# Patient Record
Sex: Female | Born: 1965 | Race: White | Hispanic: No | Marital: Single | Smoking: Never smoker
Health system: Southern US, Community
[De-identification: ages and names within clinical notes are randomized; demographics above are authoritative.]

## PROBLEM LIST (undated history)

## (undated) DIAGNOSIS — I10 Essential (primary) hypertension: Secondary | ICD-10-CM

## (undated) DIAGNOSIS — E119 Type 2 diabetes mellitus without complications: Secondary | ICD-10-CM

## (undated) DIAGNOSIS — J449 Chronic obstructive pulmonary disease, unspecified: Secondary | ICD-10-CM

## (undated) DIAGNOSIS — I219 Acute myocardial infarction, unspecified: Secondary | ICD-10-CM

---

## 2020-01-10 DIAGNOSIS — E119 Type 2 diabetes mellitus without complications: Secondary | ICD-10-CM | POA: Diagnosis not present

## 2020-01-10 DIAGNOSIS — M79601 Pain in right arm: Secondary | ICD-10-CM | POA: Diagnosis not present

## 2020-01-10 DIAGNOSIS — G4730 Sleep apnea, unspecified: Secondary | ICD-10-CM | POA: Diagnosis not present

## 2020-01-10 DIAGNOSIS — I10 Essential (primary) hypertension: Secondary | ICD-10-CM | POA: Diagnosis not present

## 2020-01-10 DIAGNOSIS — Z1389 Encounter for screening for other disorder: Secondary | ICD-10-CM | POA: Diagnosis not present

## 2020-01-10 DIAGNOSIS — M4802 Spinal stenosis, cervical region: Secondary | ICD-10-CM | POA: Diagnosis not present

## 2020-01-10 DIAGNOSIS — E7800 Pure hypercholesterolemia, unspecified: Secondary | ICD-10-CM | POA: Diagnosis not present

## 2020-01-26 DIAGNOSIS — M5412 Radiculopathy, cervical region: Secondary | ICD-10-CM | POA: Diagnosis not present

## 2020-01-26 DIAGNOSIS — M4802 Spinal stenosis, cervical region: Secondary | ICD-10-CM | POA: Diagnosis not present

## 2021-04-11 ENCOUNTER — Inpatient Hospital Stay
Admission: EM | Admit: 2021-04-11 | Discharge: 2021-04-13 | DRG: 684 | Disposition: A | Discharge status: Home / Self Care | Payer: Medicare Other | Attending: Hospitalist | Admitting: Hospitalist

## 2021-04-11 ENCOUNTER — Emergency Department: Payer: Medicare Other

## 2021-04-11 ENCOUNTER — Other Ambulatory Visit: Payer: Self-pay

## 2021-04-11 DIAGNOSIS — I252 Old myocardial infarction: Secondary | ICD-10-CM

## 2021-04-11 DIAGNOSIS — R197 Diarrhea, unspecified: Secondary | ICD-10-CM

## 2021-04-11 DIAGNOSIS — J069 Acute upper respiratory infection, unspecified: Secondary | ICD-10-CM

## 2021-04-11 DIAGNOSIS — A084 Viral intestinal infection, unspecified: Secondary | ICD-10-CM | POA: Diagnosis present

## 2021-04-11 DIAGNOSIS — N179 Acute kidney failure, unspecified: Secondary | ICD-10-CM | POA: Diagnosis not present

## 2021-04-11 DIAGNOSIS — E119 Type 2 diabetes mellitus without complications: Secondary | ICD-10-CM

## 2021-04-11 DIAGNOSIS — Z20822 Contact with and (suspected) exposure to covid-19: Secondary | ICD-10-CM | POA: Diagnosis present

## 2021-04-11 DIAGNOSIS — E860 Dehydration: Secondary | ICD-10-CM | POA: Diagnosis not present

## 2021-04-11 DIAGNOSIS — Z79899 Other long term (current) drug therapy: Secondary | ICD-10-CM

## 2021-04-11 DIAGNOSIS — R42 Dizziness and giddiness: Secondary | ICD-10-CM

## 2021-04-11 DIAGNOSIS — I10 Essential (primary) hypertension: Secondary | ICD-10-CM

## 2021-04-11 DIAGNOSIS — R1110 Vomiting, unspecified: Secondary | ICD-10-CM

## 2021-04-11 DIAGNOSIS — J449 Chronic obstructive pulmonary disease, unspecified: Secondary | ICD-10-CM | POA: Diagnosis present

## 2021-04-11 DIAGNOSIS — I951 Orthostatic hypotension: Secondary | ICD-10-CM | POA: Diagnosis present

## 2021-04-11 DIAGNOSIS — R55 Syncope and collapse: Secondary | ICD-10-CM

## 2021-04-11 DIAGNOSIS — R748 Abnormal levels of other serum enzymes: Secondary | ICD-10-CM

## 2021-04-11 DIAGNOSIS — Z885 Allergy status to narcotic agent status: Secondary | ICD-10-CM

## 2021-04-11 DIAGNOSIS — Z7984 Long term (current) use of oral hypoglycemic drugs: Secondary | ICD-10-CM

## 2021-04-11 HISTORY — DX: Acute myocardial infarction, unspecified: I21.9

## 2021-04-11 HISTORY — DX: Essential (primary) hypertension: I10

## 2021-04-11 HISTORY — DX: Type 2 diabetes mellitus without complications: E11.9

## 2021-04-11 HISTORY — DX: Chronic obstructive pulmonary disease, unspecified: J44.9

## 2021-04-11 LAB — COMPREHENSIVE METABOLIC PANEL
ALT: 19 U/L (ref 0–44)
AST: 27 U/L (ref 15–41)
Albumin: 4.1 g/dL (ref 3.5–5.0)
Alkaline Phosphatase: 77 U/L (ref 38–126)
Anion gap: 12 (ref 5–15)
BUN: 37 mg/dL — ABNORMAL HIGH (ref 6–20)
CO2: 22 mmol/L (ref 22–32)
Calcium: 9.2 mg/dL (ref 8.9–10.3)
Chloride: 99 mmol/L (ref 98–111)
Creatinine, Ser: 2.77 mg/dL — ABNORMAL HIGH (ref 0.44–1.00)
GFR, Estimated: 20 mL/min — ABNORMAL LOW (ref >60)
Glucose, Bld: 101 mg/dL — ABNORMAL HIGH (ref 70–99)
Potassium: 3.9 mmol/L (ref 3.5–5.1)
Sodium: 133 mmol/L — ABNORMAL LOW (ref 135–145)
Total Bilirubin: 0.7 mg/dL (ref 0.3–1.2)
Total Protein: 7.4 g/dL (ref 6.5–8.1)

## 2021-04-11 LAB — CBC
HCT: 32.1 % — ABNORMAL LOW (ref 36.0–46.0)
Hemoglobin: 11.3 g/dL — ABNORMAL LOW (ref 12.0–15.0)
MCH: 30.3 pg (ref 26.0–34.0)
MCHC: 35.2 g/dL (ref 30.0–36.0)
MCV: 86.1 fL (ref 80.0–100.0)
Platelets: 269 10*3/uL (ref 150–400)
RBC: 3.73 MIL/uL — ABNORMAL LOW (ref 3.87–5.11)
RDW: 12.7 % (ref 11.5–15.5)
WBC: 8.8 10*3/uL (ref 4.0–10.5)
nRBC: 0 % (ref 0.0–0.2)

## 2021-04-11 LAB — LACTIC ACID, PLASMA: Lactic Acid, Venous: 0.9 mmol/L (ref 0.5–1.9)

## 2021-04-11 LAB — LIPASE, BLOOD: Lipase: 76 U/L — ABNORMAL HIGH (ref 11–51)

## 2021-04-11 LAB — CBG MONITORING, ED: Glucose-Capillary: 137 mg/dL — ABNORMAL HIGH (ref 70–99)

## 2021-04-11 MED ORDER — LACTATED RINGERS IV BOLUS
1000.0000 mL | Freq: Once | INTRAVENOUS | Status: AC
  Administered 2021-04-11: 1000 mL via INTRAVENOUS

## 2021-04-11 MED ORDER — ONDANSETRON HCL 4 MG/2ML IJ SOLN
4.0000 mg | Freq: Once | INTRAMUSCULAR | Status: AC
  Administered 2021-04-11: 4 mg via INTRAVENOUS
  Filled 2021-04-11: qty 2

## 2021-04-11 MED ORDER — ONDANSETRON 4 MG PO TBDP
4.0000 mg | ORAL_TABLET | Freq: Once | ORAL | Status: AC | PRN
  Administered 2021-04-11: 4 mg via ORAL
  Filled 2021-04-11: qty 1

## 2021-04-11 NOTE — ED Triage Notes (Signed)
First Nurse Note:  Arrives stating that she has pneumonia and blood sugars have been running low today.  States was seen by PCP yesterday for pneumonia, but has not heard anything from them today and has not been started on antibiotics.    AAOx3.  Skin warm and dry.  Strong cough.  NAD

## 2021-04-11 NOTE — ED Triage Notes (Signed)
Pt reports that she thinks that she has pneumonia and that her blood sugar has been running low. She has a cough and nasal congestion. If she eats something she reports that if she doesn't keep it down it comes out as diarrhea.

## 2021-04-11 NOTE — ED Provider Notes (Signed)
[facility] Emergency Department Provider Note  ____________________________________________   Event Date/Time   First MD Initiated Contact with Patient 04/11/21 2301     (approximate)  I have reviewed the triage vital signs and the nursing notes.   HISTORY  Chief Complaint Emesis and Diarrhea    HPI Heather Glover is a 55 y.o. female here with nausea, vomiting, cough, general fatigue.  The patient states that her symptoms have been ongoing for the last several days.  She has had a mild cough, chest congestion, nausea, and poor p.o. intake.  Over the last 24 hours, her symptoms have worsened and she has developed significant lightheadedness with standing, general fatigue, and malaise.  She has felt very weak.  She actually fell today, striking her head, though she does not recall losing consciousness.  She is not on blood thinners.  She describes some mild, generalized abdominal discomfort as well, and general fatigue.  She has had cough and occasional shortness of breath.  She has had sputum production.  No other acute complaints.  No recent medication changes.No known sick contacts.  Denies history of kidney disease.    No past medical history on file.  There are no problems to display for this patient.     Prior to Admission medications   Not on File    Allergies Percocet [oxycodone-acetaminophen]  No family history on file.  Social History    Review of Systems  Review of Systems  Constitutional:  Positive for fatigue. Negative for fever.  HENT:  Negative for congestion and sore throat.   Eyes:  Negative for visual disturbance.  Respiratory:  Positive for cough and shortness of breath.   Cardiovascular:  Negative for chest pain.  Gastrointestinal:  Positive for nausea and vomiting. Negative for abdominal pain and diarrhea.  Genitourinary:  Negative for flank pain.  Musculoskeletal:  Negative for back pain and neck pain.  Skin:   Negative for rash and wound.  Neurological:  Positive for weakness.  All other systems reviewed and are negative.   ____________________________________________  PHYSICAL EXAM:      VITAL SIGNS: ED Triage Vitals  Enc Vitals Group     BP 04/11/21 1628 105/65     Pulse Rate 04/11/21 1628 71     Resp 04/11/21 1628 20     Temp 04/11/21 1628 99.3 F (37.4 C)     Temp Source 04/11/21 1628 Oral     SpO2 04/11/21 1628 95 %     Weight 04/11/21 1628 145 lb (65.8 kg)     Height 04/11/21 1628 5\' 2"  (1.575 m)     Head Circumference --      Peak Flow --      Pain Score 04/11/21 1656 8     Pain Loc --      Pain Edu? --      Excl. in GC? --      Physical Exam Vitals and nursing note reviewed.  Constitutional:      General: She is not in acute distress.    Appearance: She is well-developed.  HENT:     Head: Normocephalic and atraumatic.     Mouth/Throat:     Mouth: Mucous membranes are dry.  Eyes:     Conjunctiva/sclera: Conjunctivae normal.  Cardiovascular:     Rate and Rhythm: Normal rate and regular rhythm.     Heart sounds: Normal heart sounds. No murmur heard.   No friction rub.  Pulmonary:  Effort: Pulmonary effort is normal. No respiratory distress.     Breath sounds: Normal breath sounds. No wheezing or rales.  Abdominal:     General: There is no distension.     Palpations: Abdomen is soft.     Tenderness: There is no abdominal tenderness (mild, epigastric).  Musculoskeletal:     Cervical back: Neck supple.  Skin:    General: Skin is warm.     Capillary Refill: Capillary refill takes less than 2 seconds.  Neurological:     Mental Status: She is alert and oriented to person, place, and time.     Motor: No abnormal muscle tone.      ____________________________________________   LABS (all labs ordered are listed, but only abnormal results are displayed)  Labs Reviewed  LIPASE, BLOOD - Abnormal; Notable for the following components:      Result Value    Lipase 76 (*)    All other components within normal limits  COMPREHENSIVE METABOLIC PANEL - Abnormal; Notable for the following components:   Sodium 133 (*)    Glucose, Bld 101 (*)    BUN 37 (*)    Creatinine, Ser 2.77 (*)    GFR, Estimated 20 (*)    All other components within normal limits  CBC - Abnormal; Notable for the following components:   RBC 3.73 (*)    Hemoglobin 11.3 (*)    HCT 32.1 (*)    All other components within normal limits  URINALYSIS, COMPLETE (UACMP) WITH MICROSCOPIC - Abnormal; Notable for the following components:   Color, Urine YELLOW (*)    APPearance HAZY (*)    Ketones, ur 5 (*)    All other components within normal limits  CBG MONITORING, ED - Abnormal; Notable for the following components:   Glucose-Capillary 137 (*)    All other components within normal limits  RESP PANEL BY RT-PCR (FLU A&B, COVID) ARPGX2  CULTURE, BLOOD (SINGLE)  LACTIC ACID, PLASMA  LACTIC ACID, PLASMA  POC URINE PREG, ED  TROPONIN I (HIGH SENSITIVITY)    ____________________________________________  EKG: Normal sinus rhythm, ventricular rate 65.  PR 124, QRS 106, QTc 394.  No acute ST elevations or depressions.  Nonspecific ST changes. ________________________________________  RADIOLOGY All imaging, including plain films, CT scans, and ultrasounds, independently reviewed by me, and interpretations confirmed via formal radiology reads.  ED MD interpretation:   Chest x-ray: No acute abnormality CT abdomen/pelvis: Cystic lesion lower pole left kidney, otherwise unremarkable  Official radiology report(s): CT ABDOMEN PELVIS WO CONTRAST  Result Date: 04/11/2021 CLINICAL DATA:  Abdomen pain EXAM: CT ABDOMEN AND PELVIS WITHOUT CONTRAST TECHNIQUE: Multidetector CT imaging of the abdomen and pelvis was performed following the standard protocol without IV contrast. COMPARISON:  None. FINDINGS: Lower chest: Lung bases demonstrate no acute consolidation or effusion. Normal cardiac  size. Hepatobiliary: Calcified granuloma in the liver. No calcified gallstone or biliary dilatation. Pancreas: Unremarkable. No pancreatic ductal dilatation or surrounding inflammatory changes. Spleen: Normal in size without focal abnormality. Adrenals/Urinary Tract: Adrenal glands are normal. No hydronephrosis. Multiple low-attenuation lesions bilaterally incompletely characterized without contrast. Complex exophytic cyst lower pole left kidney measuring 5 x 3.6 cm with septations and calcifications. The bladder is normal Stomach/Bowel: Stomach is within normal limits. Appendix appears normal. No evidence of bowel wall thickening, distention, or inflammatory changes. 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 Vascular/Lymphatic: Mild aortic atherosclerosis. No aneurysm. No suspicious nodes Reproductive: Uterus and bilateral adnexa are unremarkable. Other: Negative for free air or free  fluid. Musculoskeletal: No acute osseous abnormality. Degenerative changes at L5-S1 IMPRESSION: 1. No CT evidence for acute intra-abdominal or pelvic abnormality. 2. Complex cystic lesion lower pole left kidney. When the patient is clinically stable and able to follow directions and hold their breath (preferably as an outpatient) further evaluation with dedicated abdominal MRI should be considered. 3. 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 Electronically Signed   By: Jasmine Pang M.D.   On: 04/11/2021 23:46   DG Chest 2 View  Result Date: 04/11/2021 CLINICAL DATA:  Cough, concern for pneumonia EXAM: CHEST - 2 VIEW COMPARISON:  None. FINDINGS: The heart size and mediastinal contours are within normal limits. Both lungs are clear. The visualized skeletal structures are unremarkable. IMPRESSION: No acute abnormality of the lungs. Electronically Signed   By: Lauralyn Primes M.D.   On: 04/11/2021 17:50    ____________________________________________  PROCEDURES   Procedure(s)  performed (including Critical Care):  Procedures  ____________________________________________  INITIAL IMPRESSION / MDM / ASSESSMENT AND PLAN / ED COURSE  As part of my medical decision making, I reviewed the following data within the electronic MEDICAL RECORD NUMBER Nursing notes reviewed and incorporated, Old chart reviewed, Notes from prior ED visits, and [place] Controlled Substance Database       *Heather Glover was evaluated in Emergency Department on 04/12/2021 for the symptoms described in the history of present illness. She was evaluated in the context of the global COVID-19 pandemic, which necessitated consideration that the patient might be at risk for infection with the SARS-CoV-2 virus that causes COVID-19. Institutional protocols and algorithms that pertain to the evaluation of patients at risk for COVID-19 are in a state of rapid change based on information released by regulatory bodies including the CDC and federal and state organizations. These policies and algorithms were followed during the patient's care in the ED.  Some ED evaluations and interventions may be delayed as a result of limited staffing during the pandemic.*     Medical Decision Making: 55 year old female here with nausea, cough, general fatigue, decreased appetite.  Patient states that her symptoms have been ongoing for several days.  She appears dehydrated clinically with orthostasis.  Lab work shows significant likely AKI as the patient has no known history of chronic kidney disease.  BUN 37, creatinine 2.77.  CBC without leukocytosis.  Lipase minimally elevated but CT scan obtained, reviewed, shows no evidence of pancreatitis.  She has a renal cyst which she states she has known about, but again denies any history of CKD.  UA shows mild dehydration.  She will be admitted for IV fluids and monitoring of lab work.  ____________________________________________  FINAL CLINICAL IMPRESSION(S) / ED DIAGNOSES  Final  diagnoses:  AKI (acute kidney injury) (HCC)  Dehydration     MEDICATIONS GIVEN DURING THIS VISIT:  Medications  ondansetron (ZOFRAN-ODT) disintegrating tablet 4 mg (4 mg Oral Given 04/11/21 1703)  lactated ringers bolus 1,000 mL (1,000 mLs Intravenous New Bag/Given 04/11/21 2323)  ondansetron (ZOFRAN) injection 4 mg (4 mg Intravenous Given 04/11/21 2359)     ED Discharge Orders     None        Note:  This document was prepared using Dragon voice recognition software and may include unintentional dictation errors.   Shaune Pollack, MD 04/12/21 [phone]

## 2021-04-12 ENCOUNTER — Encounter: Payer: Self-pay | Admitting: Internal Medicine

## 2021-04-12 ENCOUNTER — Other Ambulatory Visit: Payer: Self-pay

## 2021-04-12 DIAGNOSIS — I10 Essential (primary) hypertension: Secondary | ICD-10-CM

## 2021-04-12 DIAGNOSIS — N179 Acute kidney failure, unspecified: Secondary | ICD-10-CM | POA: Diagnosis present

## 2021-04-12 DIAGNOSIS — R197 Diarrhea, unspecified: Secondary | ICD-10-CM

## 2021-04-12 DIAGNOSIS — Z885 Allergy status to narcotic agent status: Secondary | ICD-10-CM | POA: Diagnosis not present

## 2021-04-12 DIAGNOSIS — Z7984 Long term (current) use of oral hypoglycemic drugs: Secondary | ICD-10-CM | POA: Diagnosis not present

## 2021-04-12 DIAGNOSIS — I951 Orthostatic hypotension: Secondary | ICD-10-CM | POA: Diagnosis present

## 2021-04-12 DIAGNOSIS — R748 Abnormal levels of other serum enzymes: Secondary | ICD-10-CM | POA: Diagnosis present

## 2021-04-12 DIAGNOSIS — I252 Old myocardial infarction: Secondary | ICD-10-CM | POA: Diagnosis not present

## 2021-04-12 DIAGNOSIS — J069 Acute upper respiratory infection, unspecified: Secondary | ICD-10-CM | POA: Diagnosis present

## 2021-04-12 DIAGNOSIS — E119 Type 2 diabetes mellitus without complications: Secondary | ICD-10-CM

## 2021-04-12 DIAGNOSIS — R55 Syncope and collapse: Secondary | ICD-10-CM | POA: Diagnosis not present

## 2021-04-12 DIAGNOSIS — Z79899 Other long term (current) drug therapy: Secondary | ICD-10-CM | POA: Diagnosis not present

## 2021-04-12 DIAGNOSIS — E860 Dehydration: Secondary | ICD-10-CM | POA: Diagnosis present

## 2021-04-12 DIAGNOSIS — Z20822 Contact with and (suspected) exposure to covid-19: Secondary | ICD-10-CM | POA: Diagnosis present

## 2021-04-12 DIAGNOSIS — R42 Dizziness and giddiness: Secondary | ICD-10-CM | POA: Diagnosis not present

## 2021-04-12 DIAGNOSIS — J449 Chronic obstructive pulmonary disease, unspecified: Secondary | ICD-10-CM | POA: Diagnosis present

## 2021-04-12 DIAGNOSIS — R1110 Vomiting, unspecified: Secondary | ICD-10-CM

## 2021-04-12 DIAGNOSIS — A084 Viral intestinal infection, unspecified: Secondary | ICD-10-CM | POA: Diagnosis present

## 2021-04-12 LAB — GLUCOSE, CAPILLARY
Glucose-Capillary: 106 mg/dL — ABNORMAL HIGH (ref 70–99)
Glucose-Capillary: 121 mg/dL — ABNORMAL HIGH (ref 70–99)
Glucose-Capillary: 117 mg/dL — ABNORMAL HIGH (ref 70–99)
Glucose-Capillary: 113 mg/dL — ABNORMAL HIGH (ref 70–99)
Glucose-Capillary: 162 mg/dL — ABNORMAL HIGH (ref 70–99)
Glucose-Capillary: 146 mg/dL — ABNORMAL HIGH (ref 70–99)

## 2021-04-12 LAB — URINALYSIS, COMPLETE (UACMP) WITH MICROSCOPIC
Bacteria, UA: NONE SEEN
Bilirubin Urine: NEGATIVE
Glucose, UA: NEGATIVE mg/dL
Hgb urine dipstick: NEGATIVE
Ketones, ur: 5 mg/dL — AB
Leukocytes,Ua: NEGATIVE
Nitrite: NEGATIVE
Protein, ur: NEGATIVE mg/dL
Specific Gravity, Urine: 1.018 (ref 1.005–1.030)
pH: 5 (ref 5.0–8.0)

## 2021-04-12 LAB — BASIC METABOLIC PANEL
Anion gap: 9 (ref 5–15)
BUN: 34 mg/dL — ABNORMAL HIGH (ref 6–20)
CO2: 23 mmol/L (ref 22–32)
Calcium: 9 mg/dL (ref 8.9–10.3)
Chloride: 104 mmol/L (ref 98–111)
Creatinine, Ser: 2.01 mg/dL — ABNORMAL HIGH (ref 0.44–1.00)
GFR, Estimated: 29 mL/min — ABNORMAL LOW (ref >60)
Glucose, Bld: 103 mg/dL — ABNORMAL HIGH (ref 70–99)
Potassium: 3.3 mmol/L — ABNORMAL LOW (ref 3.5–5.1)
Sodium: 136 mmol/L (ref 135–145)

## 2021-04-12 LAB — HEMOGLOBIN A1C
Hgb A1c MFr Bld: 6.4 % — ABNORMAL HIGH (ref 4.8–5.6)
Mean Plasma Glucose: 136.98 mg/dL

## 2021-04-12 LAB — TROPONIN I (HIGH SENSITIVITY): Troponin I (High Sensitivity): 9 ng/L (ref <18)

## 2021-04-12 LAB — HIV ANTIBODY (ROUTINE TESTING W REFLEX): HIV Screen 4th Generation wRfx: NONREACTIVE

## 2021-04-12 LAB — RESP PANEL BY RT-PCR (FLU A&B, COVID) ARPGX2
Influenza A by PCR: NEGATIVE
Influenza B by PCR: NEGATIVE
SARS Coronavirus 2 by RT PCR: NEGATIVE

## 2021-04-12 LAB — LACTIC ACID, PLASMA: Lactic Acid, Venous: 0.8 mmol/L (ref 0.5–1.9)

## 2021-04-12 LAB — POC URINE PREG, ED: Preg Test, Ur: NEGATIVE

## 2021-04-12 LAB — MAGNESIUM: Magnesium: 2 mg/dL (ref 1.7–2.4)

## 2021-04-12 LAB — LIPASE, BLOOD: Lipase: 80 U/L — ABNORMAL HIGH (ref 11–51)

## 2021-04-12 MED ORDER — INSULIN ASPART 100 UNIT/ML IJ SOLN
0.0000 [IU] | INTRAMUSCULAR | Status: DC
  Filled 2021-04-12: qty 1

## 2021-04-12 MED ORDER — ONDANSETRON HCL 4 MG PO TABS: 4.0000 mg | ORAL_TABLET | Freq: Four times a day (QID) | ORAL | Status: DC | PRN

## 2021-04-12 MED ORDER — POTASSIUM CHLORIDE CRYS ER 20 MEQ PO TBCR
40.0000 meq | EXTENDED_RELEASE_TABLET | Freq: Once | ORAL | Status: AC
  Administered 2021-04-12: 40 meq via ORAL
  Filled 2021-04-12: qty 2

## 2021-04-12 MED ORDER — SODIUM CHLORIDE 0.9 % IV SOLN: INTRAVENOUS | Status: DC

## 2021-04-12 MED ORDER — ACETAMINOPHEN 650 MG RE SUPP: 650.0000 mg | Freq: Four times a day (QID) | RECTAL | Status: DC | PRN

## 2021-04-12 MED ORDER — PANTOPRAZOLE SODIUM 40 MG IV SOLR
40.0000 mg | INTRAVENOUS | Status: DC
  Administered 2021-04-12 – 2021-04-13 (×2): 40 mg via INTRAVENOUS
  Filled 2021-04-12 (×2): qty 40

## 2021-04-12 MED ORDER — ONDANSETRON HCL 4 MG/2ML IJ SOLN
4.0000 mg | Freq: Four times a day (QID) | INTRAMUSCULAR | Status: DC | PRN
  Filled 2021-04-12: qty 2

## 2021-04-12 MED ORDER — ACETAMINOPHEN 325 MG PO TABS: 650.0000 mg | ORAL_TABLET | Freq: Four times a day (QID) | ORAL | Status: DC | PRN

## 2021-04-12 MED ORDER — ENOXAPARIN SODIUM 30 MG/0.3ML IJ SOSY
30.0000 mg | PREFILLED_SYRINGE | INTRAMUSCULAR | Status: DC
  Administered 2021-04-12 – 2021-04-13 (×2): 30 mg via SUBCUTANEOUS
  Filled 2021-04-12 (×2): qty 0.3

## 2021-04-12 NOTE — H&P (Addendum)
History and Physical    Heather Glover YKD:[id number] DOB: 1965/10/24 DOA: 04/11/2021  PCP: Pcp, No   Patient coming from: [facility]  I have personally briefly reviewed patient's old medical records in [facility]  Chief Complaint: Cough, vomiting, diarrhea, weakness, lightheadedness  HPI: Heather Glover is a 55 y.o. female with medical history significant for Diabetes, HTN, COPD and hx of MI who presents to the ED with a several day history of productive cough, chest congestion, low grade fever, nausea and vomiting, poor oral intake and occasional diarrhea.  She has moderate epigastric pain related to vomiting. Vomiting is non bloody and non bilious.  She had a negative covid test at her doctor's office three days ago. Over the past couple days she became increasingly weaker and lightheaded until the night of arrival when she almost passed out. She fell but did not lose consciousness.  No injury sustained.  ED course: On arrival, temperature 99.3, BP 105/65 with pulse 71 and O2 sat 95% on room air WBC normal at 8800, hemoglobin 11.1, creatinine 2.77.  LFTs WNL, lipase 76.  Lactic acid 0.9.  Troponin 9.  Urinalysis with few ketones  EKG, personally viewed and interpreted: Sinus rhythm at 65 with nonspecific ST-T wave changes  Imaging: Chest x-ray with no acute abnormality of the lungs CT abdomen and pelvis without contrast no acute intra-abdominal or pelvic abnormality  Patient treated with an IV fluid bolus and given Zofran.  Hospitalist consulted for admission.  Review of Systems: As per HPI otherwise all other systems on review of systems negative.    Past Medical History:  Diagnosis Date   COPD (chronic obstructive pulmonary disease) (HCC)    Diabetes (HCC)    HTN (hypertension)    MI (myocardial infarction) (HCC)     History reviewed. No pertinent surgical history.   reports that she has never smoked. She has never used smokeless tobacco. She reports previous alcohol use.  No history on file for drug use.  Allergies  Allergen Reactions   Acetaminophen    Oxycodone Hcl    Percocet [Oxycodone-Acetaminophen]     History reviewed. No pertinent family history.    Prior to Admission medications   Medication Sig Start Date End Date Taking? Authorizing Provider  amLODipine (NORVASC) 5 MG tablet Take 5 mg by mouth daily. 11/08/20   [provider]  benazepril-hydrochlorthiazide (LOTENSIN HCT) 20-25 MG tablet Take 1 tablet by mouth daily.    [provider]  empagliflozin (JARDIANCE) 25 MG TABS tablet Take 25 mg by mouth in the morning.    [provider]  labetalol (NORMODYNE) 200 MG tablet Take 200 mg by mouth 2 (two) times daily.    [provider]  metFORMIN (GLUCOPHAGE) 1000 MG tablet Take 1,000 mg by mouth 2 (two) times daily with a meal.    [provider]  montelukast (SINGULAIR) 10 MG tablet Take 10 mg by mouth daily. 10/19/20   [provider]  omeprazole (PRILOSEC) 40 MG capsule Take 40 mg by mouth daily. 10/19/20   [provider]  pravastatin (PRAVACHOL) 80 MG tablet Take 80 mg by mouth daily.    [provider]  sertraline (ZOLOFT) 25 MG tablet Take 25 mg by mouth every morning. 10/19/20   [provider]  SYMBICORT 160-4.5 MCG/ACT inhaler Inhale 2 puffs into the lungs 2 (two) times daily. 10/20/20   [provider]  tiZANidine (ZANAFLEX) 2 MG tablet Take 2 mg by mouth 2 (two) times daily.  [provider]  traZODone (DESYREL) 50 MG tablet Take 50 mg by mouth at bedtime as needed.    [provider]    Physical Exam: Vitals:   04/12/21 0045 04/12/21 0100 04/12/21 0115 04/12/21 0130  BP:  116/75    Pulse: 74 79 75 79  Resp: 18 20 (!) 24 (!) 33  Temp:      TempSrc:      SpO2: 98% 92% 93% 97%  Weight:      Height:         Vitals:   04/12/21 0045 04/12/21 0100 04/12/21 0115 04/12/21 0130  BP:  116/75    Pulse: 74 79 75 79  Resp: 18 20  (!) 24 (!) 33  Temp:      TempSrc:      SpO2: 98% 92% 93% 97%  Weight:      Height:          Constitutional: Alert and oriented x 3 . Not in any apparent distress HEENT:      Head: Normocephalic and atraumatic.         Eyes: PERLA, EOMI, Conjunctivae are normal. Sclera is non-icteric.       Mouth/Throat: Mucous membranes are moist.       Neck: Supple with no signs of meningismus. Cardiovascular: Regular rate and rhythm. No murmurs, gallops, or rubs. 2+ symmetrical distal pulses are present . No JVD. No LE edema Respiratory: Respiratory effort normal .Lungs sounds clear bilaterally. No wheezes, crackles, or rhonchi.  Gastrointestinal: Soft, epigastric tenderness with deep palpation, and non distended with positive bowel sounds.  Genitourinary: No CVA tenderness. Musculoskeletal: Nontender with normal range of motion in all extremities. No cyanosis, or erythema of extremities. Neurologic:  Face is symmetric. Moving all extremities. No gross focal neurologic deficits . Skin: Skin is warm, dry.  No rash or ulcers Psychiatric: Mood and affect are normal    Labs on Admission: I have personally reviewed following labs and imaging studies  CBC: Recent Labs  Lab 04/11/21 1630  WBC 8.8  HGB 11.3*  HCT 32.1*  MCV 86.1  PLT 269   Basic Metabolic Panel: Recent Labs  Lab 04/11/21 1630  NA 133*  K 3.9  CL 99  CO2 22  GLUCOSE 101*  BUN 37*  CREATININE 2.77*  CALCIUM 9.2   GFR: Estimated Creatinine Clearance: 20.4 mL/min (A) (by C-G formula based on SCr of 2.77 mg/dL (H)). Liver Function Tests: Recent Labs  Lab 04/11/21 1630  AST 27  ALT 19  ALKPHOS 77  BILITOT 0.7  PROT 7.4  ALBUMIN 4.1   Recent Labs  Lab 04/11/21 1630  LIPASE 76*   No results for input(s): AMMONIA in the last 168 hours. Coagulation Profile: No results for input(s): INR, PROTIME in the last 168 hours. Cardiac Enzymes: No results for input(s): CKTOTAL, CKMB, CKMBINDEX, TROPONINI in the last 168  hours. BNP (last 3 results) No results for input(s): PROBNP in the last 8760 hours. HbA1C: No results for input(s): HGBA1C in the last 72 hours. CBG: Recent Labs  Lab 04/11/21 2120  GLUCAP 137*   Lipid Profile: No results for input(s): CHOL, HDL, LDLCALC, TRIG, CHOLHDL, LDLDIRECT in the last 72 hours. Thyroid Function Tests: No results for input(s): TSH, T4TOTAL, FREET4, T3FREE, THYROIDAB in the last 72 hours. Anemia Panel: No results for input(s): VITAMINB12, FOLATE, FERRITIN, TIBC, IRON, RETICCTPCT in the last 72 hours. Urine analysis:    Component Value Date/Time   COLORURINE YELLOW (A) 04/11/2021 2326  APPEARANCEUR HAZY (A) 04/11/2021 2326   LABSPEC 1.018 04/11/2021 2326   PHURINE 5.0 04/11/2021 2326   GLUCOSEU NEGATIVE 04/11/2021 2326   HGBUR NEGATIVE 04/11/2021 2326   BILIRUBINUR NEGATIVE 04/11/2021 2326   KETONESUR 5 (A) 04/11/2021 2326   PROTEINUR NEGATIVE 04/11/2021 2326   NITRITE NEGATIVE 04/11/2021 2326   LEUKOCYTESUR NEGATIVE 04/11/2021 2326    Radiological Exams on Admission: CT ABDOMEN PELVIS WO CONTRAST  Result Date: 04/11/2021 CLINICAL DATA:  Abdomen pain EXAM: CT ABDOMEN AND PELVIS WITHOUT CONTRAST TECHNIQUE: Multidetector CT imaging of the abdomen and pelvis was performed following the standard protocol without IV contrast. COMPARISON:  None. FINDINGS: Lower chest: Lung bases demonstrate no acute consolidation or effusion. Normal cardiac size. Hepatobiliary: Calcified granuloma in the liver. No calcified gallstone or biliary dilatation. Pancreas: Unremarkable. No pancreatic ductal dilatation or surrounding inflammatory changes. Spleen: Normal in size without focal abnormality. Adrenals/Urinary Tract: Adrenal glands are normal. No hydronephrosis. Multiple low-attenuation lesions bilaterally incompletely characterized without contrast. Complex exophytic cyst lower pole left kidney measuring 5 x 3.6 cm with septations and calcifications. The bladder is normal  Stomach/Bowel: Stomach is within normal limits. Appendix appears normal. No evidence of bowel wall thickening, distention, or inflammatory changes. 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 Vascular/Lymphatic: Mild aortic atherosclerosis. No aneurysm. No suspicious nodes Reproductive: Uterus and bilateral adnexa are unremarkable. Other: Negative for free air or free fluid. Musculoskeletal: No acute osseous abnormality. Degenerative changes at L5-S1 IMPRESSION: 1. No CT evidence for acute intra-abdominal or pelvic abnormality. 2. Complex cystic lesion lower pole left kidney. When the patient is clinically stable and able to follow directions and hold their breath (preferably as an outpatient) further evaluation with dedicated abdominal MRI should be considered. 3. 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 Electronically Signed   By: Jasmine Pang M.D.   On: 04/11/2021 23:46   DG Chest 2 View  Result Date: 04/11/2021 CLINICAL DATA:  Cough, concern for pneumonia EXAM: CHEST - 2 VIEW COMPARISON:  None. FINDINGS: The heart size and mediastinal contours are within normal limits. Both lungs are clear. The visualized skeletal structures are unremarkable. IMPRESSION: No acute abnormality of the lungs. Electronically Signed   By: Lauralyn Primes M.D.   On: 04/11/2021 17:50     Assessment/Plan 55 year old female with history of diabetes, HTN, COPD, MI who presents to the ED with a several day history of productive cough, chest congestion, nausea vomiting, poor oral intake and occasional diarrhea.      Postural dizziness with presyncope - Secondary to orthostatic hypotension from dehydration from vomiting and diarrhea and URI - IV hydration and treat acute etiology    Vomiting and diarrhea - Suspect viral gastroenteritis +/-medication related -IV hydration - N.p.o. except ice chips then advance as tolerated - IV antiemetics, IV protonix and supportive care    AKI  (acute kidney injury) (HCC) - Creatinine 2.77 but baseline unknown with patient denies prior history of kidney disease - Prerenal from GI losses - IV hydration - Monitor renal function and avoid nephrotoxins  Elevated lipase - Suspect reactive ? Jardiance - CT abdomen and pelvis with no acute findings - Watch trend   URI (upper respiratory infection), possible bronchitis - Patient with several day history of cough mild shortness of breath - No pneumonia on chest x-ray and COVID and flu test negative - Suspect viral URI - Albuterol as needed  COPD - Not acutely exacerbated.  As needed inhalers  History of MI --No complaints of chest  pain, EKG non-acute and troponin negative at 9.    HTN (hypertension) - Hold antihypertensives due to borderline blood pressure    Diabetes mellitus, type II (HCC) - Sliding scale insulin coverage    DVT prophylaxis: Lovenox  Code Status: full code  Family Communication:  none  Disposition Plan: Back to previous home environment Consults called: none  Status: Observation    Andris BaumannHazel V Diangelo Radel MD [facility]     04/12/2021, 1:32 AM

## 2021-04-12 NOTE — ED Notes (Signed)
Pt. Up to bathroom indep. After unhook.

## 2021-04-13 LAB — CBC
HCT: 27.5 % — ABNORMAL LOW (ref 36.0–46.0)
Hemoglobin: 9.6 g/dL — ABNORMAL LOW (ref 12.0–15.0)
MCH: 30.7 pg (ref 26.0–34.0)
MCHC: 34.9 g/dL (ref 30.0–36.0)
MCV: 87.9 fL (ref 80.0–100.0)
Platelets: 255 10*3/uL (ref 150–400)
RBC: 3.13 MIL/uL — ABNORMAL LOW (ref 3.87–5.11)
RDW: 12.7 % (ref 11.5–15.5)
WBC: 7.7 10*3/uL (ref 4.0–10.5)
nRBC: 0 % (ref 0.0–0.2)

## 2021-04-13 LAB — BASIC METABOLIC PANEL
Anion gap: 5 (ref 5–15)
BUN: 24 mg/dL — ABNORMAL HIGH (ref 6–20)
CO2: 25 mmol/L (ref 22–32)
Calcium: 8.3 mg/dL — ABNORMAL LOW (ref 8.9–10.3)
Chloride: 108 mmol/L (ref 98–111)
Creatinine, Ser: 1.27 mg/dL — ABNORMAL HIGH (ref 0.44–1.00)
GFR, Estimated: 50 mL/min — ABNORMAL LOW (ref >60)
Glucose, Bld: 120 mg/dL — ABNORMAL HIGH (ref 70–99)
Potassium: 3.9 mmol/L (ref 3.5–5.1)
Sodium: 138 mmol/L (ref 135–145)

## 2021-04-13 LAB — MAGNESIUM: Magnesium: 1.9 mg/dL (ref 1.7–2.4)

## 2021-04-13 LAB — GLUCOSE, CAPILLARY
Glucose-Capillary: 144 mg/dL — ABNORMAL HIGH (ref 70–99)
Glucose-Capillary: 142 mg/dL — ABNORMAL HIGH (ref 70–99)

## 2021-04-13 MED ORDER — BENAZEPRIL-HYDROCHLOROTHIAZIDE 20-25 MG PO TABS: ORAL_TABLET | ORAL | Status: AC

## 2021-04-13 MED ORDER — AMLODIPINE BESYLATE 5 MG PO TABS: ORAL_TABLET | ORAL | Status: AC

## 2021-04-13 MED ORDER — LABETALOL HCL 200 MG PO TABS: ORAL_TABLET | ORAL | Status: AC

## 2021-04-13 NOTE — Progress Notes (Signed)
Patient discharging home, Instructions given to patient, verbalized understanding. Family to transport home.

## 2021-04-13 NOTE — Discharge Summary (Signed)
Physician Discharge Summary   Heather Glover  female DOB: [birth year]  VWU:[id number]:[id number]  PCP: Pcp, No  Admit date: 04/11/2021 Discharge date: 04/13/2021  Admitted From: home Disposition:  home CODE STATUS: Full code  Discharge Instructions     Discharge instructions   Complete by: As directed    Because of your nauseam, vomiting, diarrhea and poor oral intake, you were dehydrated and had acute kidney injury.  Kidney injury has improved with IVF.  You can go home to recover and be sure to keep hydrated.   Your blood pressure has been low normal during your hospitalization.  Please hold all of your blood pressure medications until followup with your PCP.   Dr. Darlin Priestlyina Randee [facility]- -        Hospital Course:  For full details, please see H&P, progress notes, consult notes and ancillary notes.  Briefly,  Heather Glover is a 55 y.o. female with medical history significant for Diabetes, HTN, COPD and hx of MI who presented to the ED with a several day history of productive cough, chest congestion, low grade fever, nausea and vomiting, poor oral intake and occasional diarrhea.     Postural dizziness with presyncope - Secondary to orthostatic hypotension from dehydration from vomiting and diarrhea and URI --symptoms improved with IVF.    Vomiting and diarrhea - Suspect viral gastroenteritis --Pt received IVF hydration.  Symptoms improved and pt was tolerating oral intake prior to discharge.     AKI (acute kidney injury) (HCC) - Creatinine 2.77 on presentation.  Baseline unknown.  Cr improved to 1.27 prior to discharge with IVF.  Elevated lipase, mild --76 and then 80.  Likely not clinically significant.   - CT abdomen and pelvis with no acute findings    URI (upper respiratory infection), possible bronchitis - Patient with several day history of cough mild shortness of breath - No pneumonia on chest x-ray and COVID and flu test negative - Suspect viral URI  COPD - Not acutely  exacerbated.  As needed inhalers   History of MI --No complaints of chest pain, EKG non-acute and troponin negative at 9.     HTN (hypertension) - BP had been low normal during hospitalization even without BP medication.  All home BP meds held pending outpatient PCP followup.     Diabetes mellitus, type II (HCC), well controlled --A1c 6.4. --cont home regimen (see below).   Discharge Diagnoses:  Principal Problem:   Postural dizziness with presyncope Active Problems:   Vomiting and diarrhea   AKI (acute kidney injury) (HCC)   HTN (hypertension)   Diabetes mellitus, type II (HCC)   URI (upper respiratory infection)   Elevated lipase   30 Day Unplanned Readmission Risk Score    Flowsheet Row ED to Hosp-Admission (Current) from [facility]/11/2021 in [facility] (1A)  30 Day Unplanned Readmission Risk Score (%) 13.48 Filed at 04/13/2021 0400       This score is the patient's risk of an unplanned readmission within 30 days of being discharged (0 -100%). The score is based on dignosis, age, lab data, medications, orders, and past utilization.   Low:  0-14.9   Medium: 15-21.9   High: 22-29.9   Extreme: 30 and above         Discharge Instructions:  Allergies as of 04/13/2021       Reactions   Acetaminophen Nausea And Vomiting, Rash   Oxycodone Hcl Nausea And Vomiting, Rash   Percocet [oxycodone-acetaminophen] Rash  Medication List     STOP taking these medications    omeprazole 40 MG capsule Commonly known as: PRILOSEC       TAKE these medications    amLODipine 5 MG tablet Commonly known as: NORVASC Hold until followup with your outpatient doctor because your blood pressure was low normal without any BP medication. What changed:  how much to take how to take this when to take this additional instructions   Aspirin Low Dose 81 MG EC tablet Generic drug: aspirin Take 81 mg by mouth daily.   benazepril-hydrochlorthiazide  20-25 MG tablet Commonly known as: LOTENSIN HCT Hold until followup with your outpatient doctor because your blood pressure was low normal without any BP medication. What changed:  how much to take how to take this when to take this additional instructions   empagliflozin 25 MG Tabs tablet Commonly known as: JARDIANCE Take 25 mg by mouth in the morning.   gabapentin 300 MG capsule Commonly known as: NEURONTIN Take 300 mg by mouth 3 (three) times daily.   labetalol 200 MG tablet Commonly known as: NORMODYNE Hold until followup with your outpatient doctor because your blood pressure was low normal without any BP medication. What changed:  how much to take how to take this when to take this additional instructions   metFORMIN 1000 MG tablet Commonly known as: GLUCOPHAGE Take 1,000 mg by mouth 2 (two) times daily with a meal.   montelukast 10 MG tablet Commonly known as: SINGULAIR Take 10 mg by mouth daily.   nitroGLYCERIN 0.4 MG SL tablet Commonly known as: NITROSTAT Place 0.4 mg under the tongue every 5 (five) minutes as needed for chest pain.   pravastatin 80 MG tablet Commonly known as: PRAVACHOL Take 80 mg by mouth daily.   sertraline 25 MG tablet Commonly known as: ZOLOFT Take 25 mg by mouth every morning.   Symbicort 160-4.5 MCG/ACT inhaler Generic drug: budesonide-formoterol Inhale 2 puffs into the lungs 2 (two) times daily.   tizanidine 2 MG capsule Commonly known as: ZANAFLEX Take 2 mg by mouth every 6 (six) hours as needed. What changed: Another medication with the same name was removed. Continue taking this medication, and follow the directions you see here.   traZODone 50 MG tablet Commonly known as: DESYREL Take 50 mg by mouth at bedtime as needed.         Follow-up Information     your PCP Follow up in 1 week(s).                  Allergies  Allergen Reactions   Acetaminophen Nausea And Vomiting and Rash   Oxycodone Hcl Nausea  And Vomiting and Rash   Percocet [Oxycodone-Acetaminophen] Rash     The results of significant diagnostics from this hospitalization (including imaging, microbiology, ancillary and laboratory) are listed below for reference.   Consultations:   Procedures/Studies: CT ABDOMEN PELVIS WO CONTRAST  Result Date: 04/11/2021 CLINICAL DATA:  Abdomen pain EXAM: CT ABDOMEN AND PELVIS WITHOUT CONTRAST TECHNIQUE: Multidetector CT imaging of the abdomen and pelvis was performed following the standard protocol without IV contrast. COMPARISON:  None. FINDINGS: Lower chest: Lung bases demonstrate no acute consolidation or effusion. Normal cardiac size. Hepatobiliary: Calcified granuloma in the liver. No calcified gallstone or biliary dilatation. Pancreas: Unremarkable. No pancreatic ductal dilatation or surrounding inflammatory changes. Spleen: Normal in size without focal abnormality. Adrenals/Urinary Tract: Adrenal glands are normal. No hydronephrosis. Multiple low-attenuation lesions bilaterally incompletely characterized without contrast. Complex exophytic cyst lower  pole left kidney measuring 5 x 3.6 cm with septations and calcifications. The bladder is normal Stomach/Bowel: Stomach is within normal limits. Appendix appears normal. No evidence of bowel wall thickening, distention, or inflammatory changes. 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 Vascular/Lymphatic: Mild aortic atherosclerosis. No aneurysm. No suspicious nodes Reproductive: Uterus and bilateral adnexa are unremarkable. Other: Negative for free air or free fluid. Musculoskeletal: No acute osseous abnormality. Degenerative changes at L5-S1 IMPRESSION: 1. No CT evidence for acute intra-abdominal or pelvic abnormality. 2. Complex cystic lesion lower pole left kidney. When the patient is clinically stable and able to follow directions and hold their breath (preferably as an outpatient) further evaluation with dedicated abdominal MRI  should be considered. 3. 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 Electronically Signed   By: Jasmine Pang M.D.   On: 04/11/2021 23:46   DG Chest 2 View  Result Date: 04/11/2021 CLINICAL DATA:  Cough, concern for pneumonia EXAM: CHEST - 2 VIEW COMPARISON:  None. FINDINGS: The heart size and mediastinal contours are within normal limits. Both lungs are clear. The visualized skeletal structures are unremarkable. IMPRESSION: No acute abnormality of the lungs. Electronically Signed   By: Lauralyn Primes M.D.   On: 04/11/2021 17:50      Labs: BNP (last 3 results) No results for input(s): BNP in the last 8760 hours. Basic Metabolic Panel: Recent Labs  Lab 04/11/21 1630 04/12/21 0506 04/13/21 0424  NA 133* 136 138  K 3.9 3.3* 3.9  CL 99 104 108  CO2 22 23 25   GLUCOSE 101* 103* 120*  BUN 37* 34* 24*  CREATININE 2.77* 2.01* 1.27*  CALCIUM 9.2 9.0 8.3*  MG  --  2.0 1.9   Liver Function Tests: Recent Labs  Lab 04/11/21 1630  AST 27  ALT 19  ALKPHOS 77  BILITOT 0.7  PROT 7.4  ALBUMIN 4.1   Recent Labs  Lab 04/11/21 1630 04/12/21 0506  LIPASE 76* 80*   No results for input(s): AMMONIA in the last 168 hours. CBC: Recent Labs  Lab 04/11/21 1630 04/13/21 0424  WBC 8.8 7.7  HGB 11.3* 9.6*  HCT 32.1* 27.5*  MCV 86.1 87.9  PLT 269 255   Cardiac Enzymes: No results for input(s): CKTOTAL, CKMB, CKMBINDEX, TROPONINI in the last 168 hours. BNP: Invalid input(s): POCBNP CBG: Recent Labs  Lab 04/12/21 1600 04/12/21 1931 04/12/21 2327 04/13/21 0356 04/13/21 0742  GLUCAP 113* 162* 146* 144* 142*   D-Dimer No results for input(s): DDIMER in the last 72 hours. Hgb A1c Recent Labs    04/12/21 0506  HGBA1C 6.4*   Lipid Profile No results for input(s): CHOL, HDL, LDLCALC, TRIG, CHOLHDL, LDLDIRECT in the last 72 hours. Thyroid function studies No results for input(s): TSH, T4TOTAL, T3FREE, THYROIDAB in the last 72 hours.  Invalid input(s):  FREET3 Anemia work up No results for input(s): VITAMINB12, FOLATE, FERRITIN, TIBC, IRON, RETICCTPCT in the last 72 hours. Urinalysis    Component Value Date/Time   COLORURINE YELLOW (A) 04/11/2021 2326   APPEARANCEUR HAZY (A) 04/11/2021 2326   LABSPEC 1.018 04/11/2021 2326   PHURINE 5.0 04/11/2021 2326   GLUCOSEU NEGATIVE 04/11/2021 2326   HGBUR NEGATIVE 04/11/2021 2326   BILIRUBINUR NEGATIVE 04/11/2021 2326   KETONESUR 5 (A) 04/11/2021 2326   PROTEINUR NEGATIVE 04/11/2021 2326   NITRITE NEGATIVE 04/11/2021 2326   LEUKOCYTESUR NEGATIVE 04/11/2021 2326   Sepsis Labs Invalid input(s): PROCALCITONIN,  WBC,  LACTICIDVEN Microbiology Recent Results (from the past 240 hour(s))  Blood culture (single)     Status: None (Preliminary result)   Collection Time: 04/11/21 11:26 PM   Specimen: BLOOD  Result Value Ref Range Status   Specimen Description BLOOD LEFT ANTECUBITAL  Final   Special Requests   Final    BOTTLES DRAWN AEROBIC AND ANAEROBIC Blood Culture adequate volume   Culture   Final    NO GROWTH 2 DAYS Performed at [facility], [address] [place], [place] [address]    Report Status PENDING  Incomplete  Resp Panel by RT-PCR (Flu A&B, Covid) Urine, Clean Catch     Status: None   Collection Time: 04/11/21 11:26 PM   Specimen: Urine, Clean Catch; Nasopharyngeal(NP) swabs in vial transport medium  Result Value Ref Range Status   SARS Coronavirus 2 by RT PCR NEGATIVE NEGATIVE Final    Comment: (NOTE) SARS-CoV-2 target nucleic acids are NOT DETECTED.  The SARS-CoV-2 RNA is generally detectable in upper respiratory specimens during the acute phase of infection. The lowest concentration of SARS-CoV-2 viral copies this assay can detect is 138 copies/mL. A negative result does not preclude SARS-Cov-2 infection and should not be used as the sole basis for treatment or other patient management decisions. A negative result may occur with  improper specimen  collection/handling, submission of specimen other than nasopharyngeal swab, presence of viral mutation(s) within the areas targeted by this assay, and inadequate number of viral copies(<138 copies/mL). A negative result must be combined with clinical observations, patient history, and epidemiological information. The expected result is Negative.  Fact Sheet for Patients:  BloggerCourse.com  Fact Sheet for Healthcare Providers:  SeriousBroker.it  This test is no t yet approved or cleared by the [place] FDA and  has been authorized for detection and/or diagnosis of SARS-CoV-2 by FDA under an Emergency Use Authorization (EUA). This EUA will remain  in effect (meaning this test can be used) for the duration of the COVID-19 declaration under Section 564(b)(1) of the Act, 21 U.S.C.section 360bbb-3(b)(1), unless the authorization is terminated  or revoked sooner.       Influenza A by PCR NEGATIVE NEGATIVE Final   Influenza B by PCR NEGATIVE NEGATIVE Final    Comment: (NOTE) The Xpert Xpress SARS-CoV-2/FLU/RSV plus assay is intended as an aid in the diagnosis of influenza from Nasopharyngeal swab specimens and should not be used as a sole basis for treatment. Nasal washings and aspirates are unacceptable for Xpert Xpress SARS-CoV-2/FLU/RSV testing.  Fact Sheet for Patients: BloggerCourse.com  Fact Sheet for Healthcare Providers: SeriousBroker.it  This test is not yet approved or cleared by the [place] FDA and has been authorized for detection and/or diagnosis of SARS-CoV-2 by FDA under an Emergency Use Authorization (EUA). This EUA will remain in effect (meaning this test can be used) for the duration of the COVID-19 declaration under Section 564(b)(1) of the Act, 21 U.S.C. section 360bbb-3(b)(1), unless the authorization is terminated or revoked.  Performed at [facility], [address] [place], [place] [address]      Total time spend on discharging this patient, including the last patient exam, discussing the hospital stay, instructions for ongoing care as it relates to all pertinent caregivers, as well as preparing the medical discharge records, prescriptions, and/or referrals as applicable, is 45 minutes.    Darlin Priestly, MD  [facility] 04/13/2021, 7:49 AM

## 2021-04-13 NOTE — Plan of Care (Signed)

## 2021-04-16 LAB — CULTURE, BLOOD (SINGLE)
Culture: NO GROWTH
Special Requests: ADEQUATE

## 2021-11-04 ENCOUNTER — Other Ambulatory Visit: Payer: Self-pay | Admitting: Family Medicine

## 2021-11-04 DIAGNOSIS — Z1231 Encounter for screening mammogram for malignant neoplasm of breast: Secondary | ICD-10-CM

## 2022-05-09 IMAGING — CT CT ABD-PELV W/O CM
2 of 4 series · 16 of 46 positions shown, 18 images · non-contrast
Comparison: None.

CLINICAL DATA: Abdomen pain

EXAM:
CT ABDOMEN AND PELVIS WITHOUT CONTRAST
TECHNIQUE: Multidetector CT imaging of the abdomen and pelvis was performed
following the standard protocol without IV contrast.

[Series 2: routine abd/pel wo · axial · 0.83mm/px · z∈[-715,-320]mm · 13 of 87 slices shown, 15 images]
[im 4/87  soft-tissue]
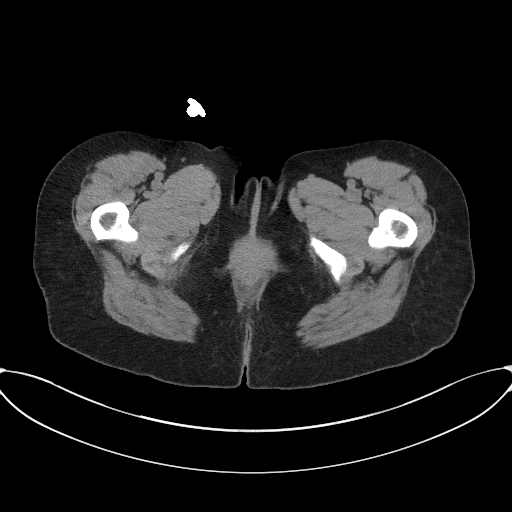
[im 4/87  bone]
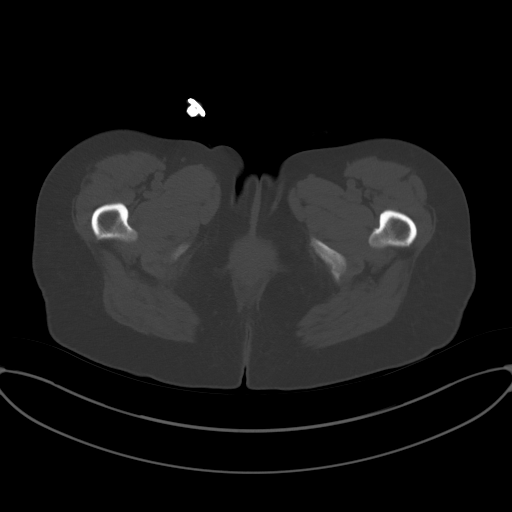
[im 11/87  soft-tissue]
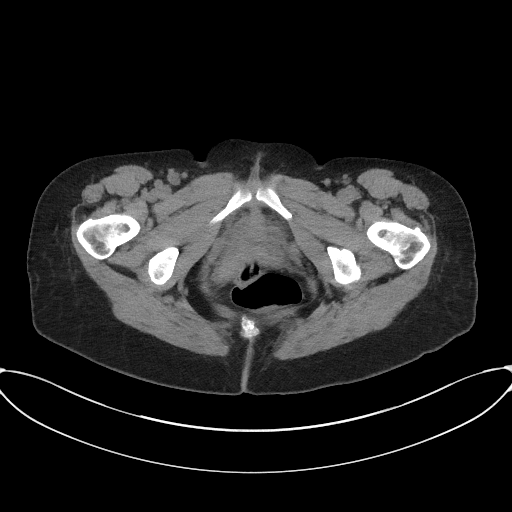
[im 18/87  soft-tissue]
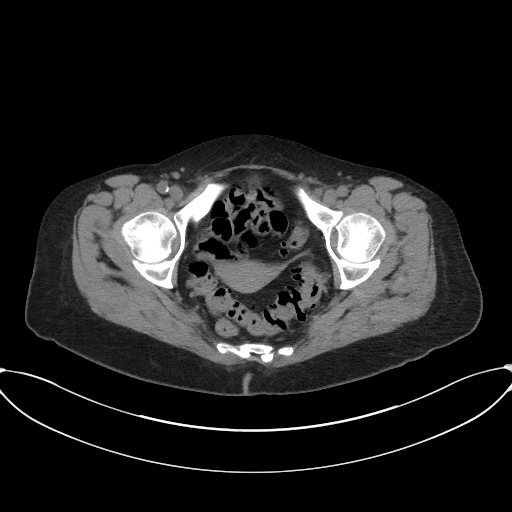
[im 26/87  soft-tissue]
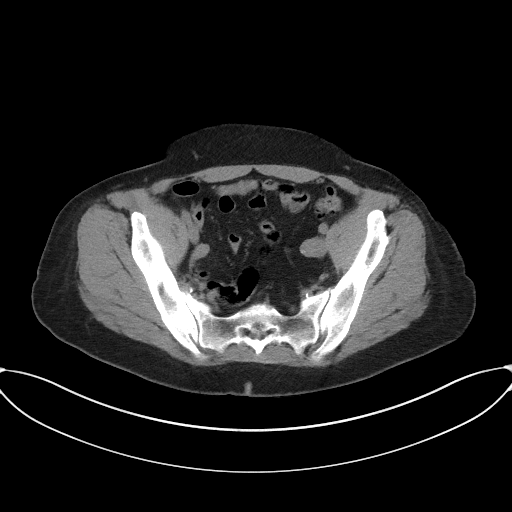
[im 29/87  soft-tissue]
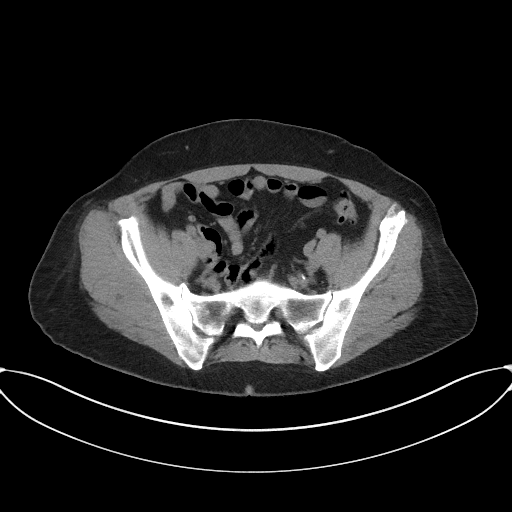
[im 36/87  soft-tissue]
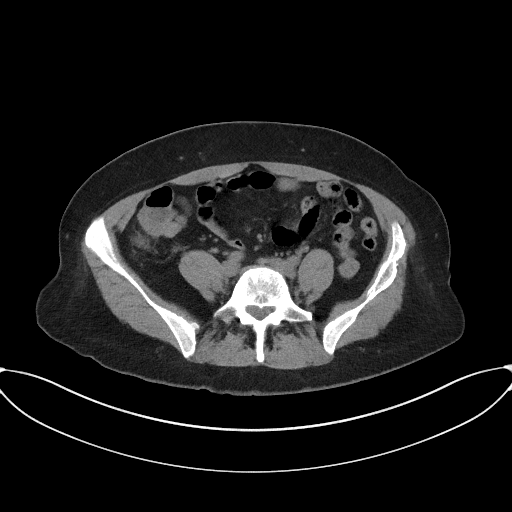
[im 44/87  soft-tissue]
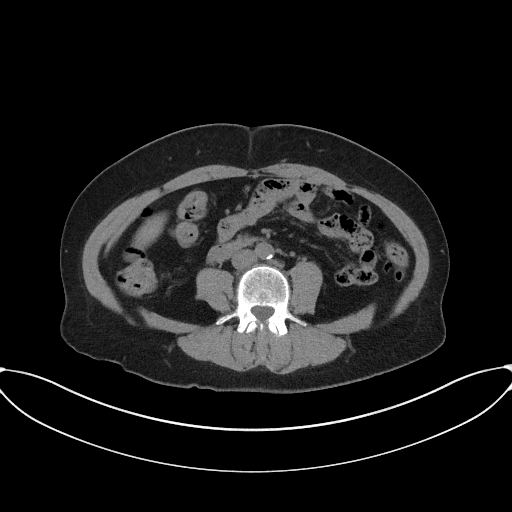
[im 51/87  soft-tissue]
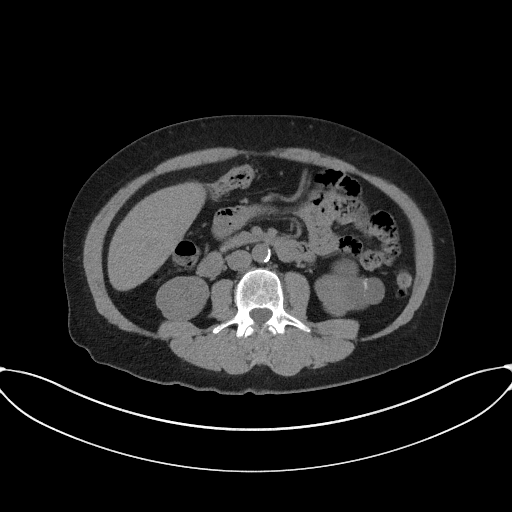
[im 58/87  soft-tissue]
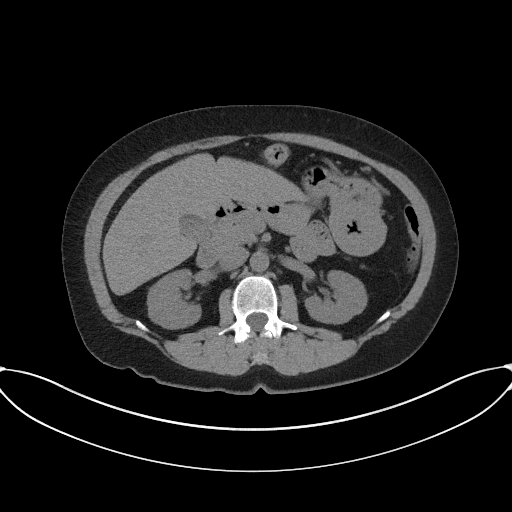
[im 58/87  bone]
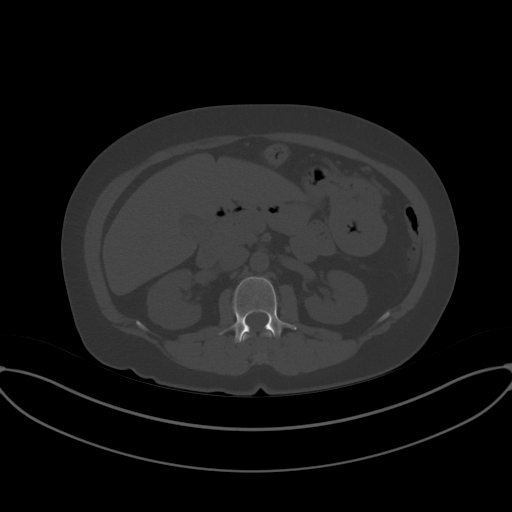
[im 61/87  soft-tissue]
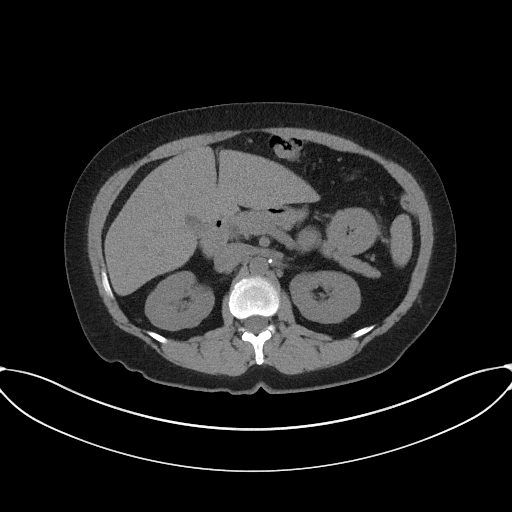
[im 69/87  soft-tissue]
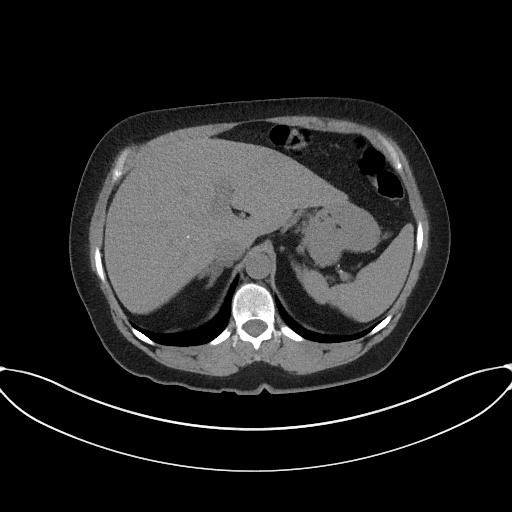
[im 76/87  soft-tissue]
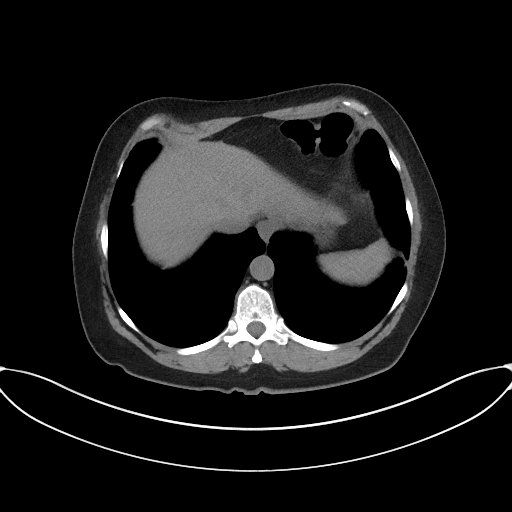
[im 83/87  soft-tissue]
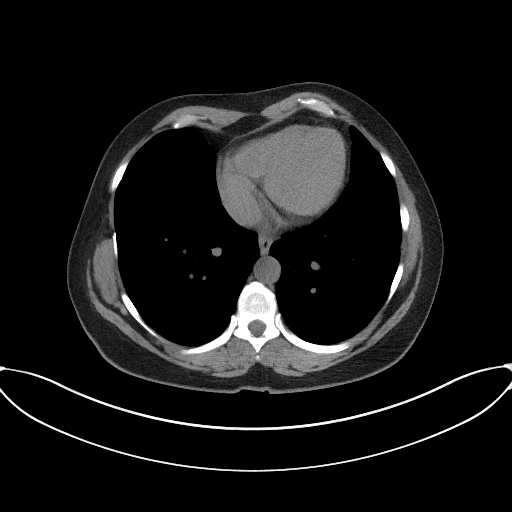

[Series 5: coronal st · coronal · 0.75mm/px · 3 of 89 slices shown]
[im 30/89  soft-tissue]
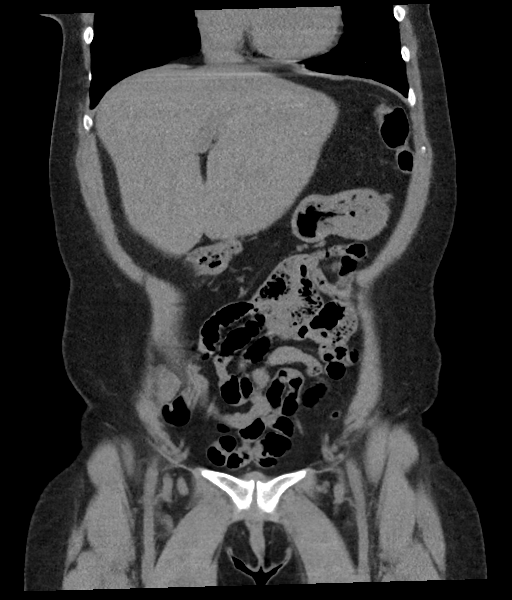
[im 40/89  soft-tissue]
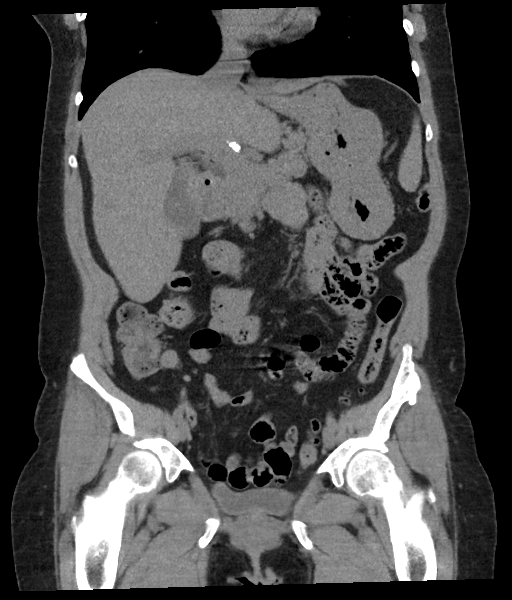
[im 49/89  soft-tissue]
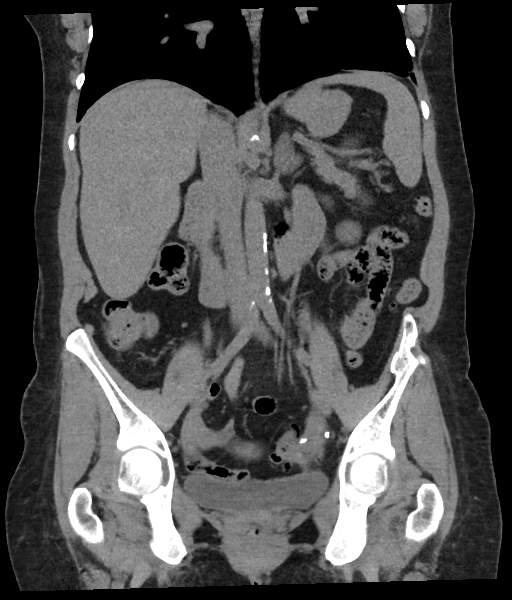

[16 of 46 positions shown; findings below may reference images not displayed]

FINDINGS: Lower chest: Lung bases demonstrate no acute consolidation or
effusion. Normal cardiac size.

Hepatobiliary: Calcified granuloma in the liver. No calcified
gallstone or biliary dilatation.

Pancreas: Unremarkable. No pancreatic ductal dilatation or
surrounding inflammatory changes.

Spleen: Normal in size without focal abnormality.

Adrenals/Urinary Tract: Adrenal glands are normal. No
hydronephrosis. Multiple low-attenuation lesions bilaterally
incompletely characterized without contrast. Complex exophytic cyst
lower pole left kidney measuring 5 x 3.6 cm with septations and
calcifications. The bladder is normal

Stomach/Bowel: Stomach is within normal limits. Appendix appears
normal. No evidence of bowel wall thickening, distention, or
inflammatory changes. Diverticular disease of left colon without
acute inflammation.

Vascular/Lymphatic: Mild aortic atherosclerosis. No aneurysm. No
suspicious nodes

Reproductive: Uterus and bilateral adnexa are unremarkable.

Other: Negative for free air or free fluid.

Musculoskeletal: No acute osseous abnormality. Degenerative changes
at L5-S1
IMPRESSION: 1. No CT evidence for acute intra-abdominal or pelvic abnormality.
2. Complex cystic lesion lower pole left kidney. When the patient is
clinically stable and able to follow directions and hold their
breath (preferably as an outpatient) further evaluation with
dedicated abdominal MRI should be considered.
3. Diverticular disease of left colon without acute inflammatory
process.
